# Patient Record
Sex: Female | Born: 1965 | Race: Black or African American | Marital: Married | Smoking: Never smoker
Health system: Southern US, Community
[De-identification: ages and names within clinical notes are randomized; demographics above are authoritative.]

## PROBLEM LIST (undated history)

## (undated) DIAGNOSIS — I10 Essential (primary) hypertension: Secondary | ICD-10-CM

## (undated) DIAGNOSIS — E119 Type 2 diabetes mellitus without complications: Secondary | ICD-10-CM

---

## 2015-04-17 ENCOUNTER — Emergency Department
Admission: EM | Admit: 2015-04-17 | Discharge: 2015-04-17 | Disposition: A | Discharge status: Home / Self Care | Payer: Self-pay | Attending: Emergency Medicine | Admitting: Emergency Medicine

## 2015-04-17 ENCOUNTER — Encounter: Payer: Self-pay | Admitting: Emergency Medicine

## 2015-04-17 ENCOUNTER — Emergency Department: Payer: Self-pay

## 2015-04-17 DIAGNOSIS — Y998 Other external cause status: Secondary | ICD-10-CM | POA: Insufficient documentation

## 2015-04-17 DIAGNOSIS — Y9289 Other specified places as the place of occurrence of the external cause: Secondary | ICD-10-CM | POA: Insufficient documentation

## 2015-04-17 DIAGNOSIS — I10 Essential (primary) hypertension: Secondary | ICD-10-CM | POA: Insufficient documentation

## 2015-04-17 DIAGNOSIS — Y9389 Activity, other specified: Secondary | ICD-10-CM | POA: Insufficient documentation

## 2015-04-17 DIAGNOSIS — E119 Type 2 diabetes mellitus without complications: Secondary | ICD-10-CM | POA: Insufficient documentation

## 2015-04-17 DIAGNOSIS — W01198A Fall on same level from slipping, tripping and stumbling with subsequent striking against other object, initial encounter: Secondary | ICD-10-CM | POA: Insufficient documentation

## 2015-04-17 DIAGNOSIS — S8252XA Displaced fracture of medial malleolus of left tibia, initial encounter for closed fracture: Secondary | ICD-10-CM | POA: Insufficient documentation

## 2015-04-17 HISTORY — DX: Type 2 diabetes mellitus without complications: E11.9

## 2015-04-17 HISTORY — DX: Essential (primary) hypertension: I10

## 2015-04-17 LAB — GLUCOSE, CAPILLARY: Glucose-Capillary: 165 mg/dL — ABNORMAL HIGH (ref 65–99)

## 2015-04-17 MED ORDER — HYDROCODONE-ACETAMINOPHEN 5-325 MG PO TABS: 1.0000 | ORAL_TABLET | ORAL | Status: AC | PRN

## 2015-04-17 MED ORDER — HYDROCODONE-ACETAMINOPHEN 5-325 MG PO TABS
1.0000 | ORAL_TABLET | Freq: Once | ORAL | Status: AC
  Administered 2015-04-17: 1 via ORAL
  Filled 2015-04-17: qty 1

## 2015-04-17 NOTE — ED Provider Notes (Signed)
[facility] Emergency Department Provider Note  ____________________________________________  Time seen: Approximately 12:45 PM  I have reviewed the triage vital signs and the nursing notes.   HISTORY  Chief Complaint Fall   HPI Cynthia Carter is a 49 y.o. female states she fell 2 days ago twisting her left ankle. She continues to have ankle pain and states that pain is increased with trying to bear full weight.Patient states that she lost her balance causing her to fall. It is remains swollen since that time and has increasing pain. She denies any head injury or loss of consciousness during this event. Patient states that currently she is living in this area and that she does not have any money or transportation. Patient currently is not taking any of her prescribed medication as she does not have a doctor in this area. Patient states she was recently came to [facility].   Past Medical History  Diagnosis Date  . Diabetes mellitus without complication (HCC)   . Hypertension     There are no active problems to display for this patient.   History reviewed. No pertinent past surgical history.  Current Outpatient Rx  Name  Route  Sig  Dispense  Refill  . HYDROcodone-acetaminophen (NORCO/VICODIN) 5-325 MG tablet   Oral   Take 1 tablet by mouth every 4 (four) hours as needed for moderate pain.   20 tablet   0     Allergies Excedrin extra strength  History reviewed. No pertinent family history.  Social History Social History  Substance Use Topics  . Smoking status: Never Smoker   . Smokeless tobacco: None  . Alcohol Use: Yes    Review of Systems Constitutional: No fever/chills Eyes: No visual changes. ENT: No trauma Cardiovascular: Denies chest pain. Respiratory: Denies shortness of breath. Gastrointestinal:  No nausea, no vomiting.   Musculoskeletal: Negative for back pain. Positive left ankle pain Skin: Negative for  rash. Neurological: Negative for headaches, focal weakness or numbness.  10-point ROS otherwise negative.  ____________________________________________   PHYSICAL EXAM:  VITAL SIGNS: ED Triage Vitals  Enc Vitals Group     BP 04/17/15 1202 127/86 mmHg     Pulse Rate 04/17/15 1202 88     Resp 04/17/15 1202 18     Temp 04/17/15 1202 97.7 F (36.5 C)     Temp Source 04/17/15 1202 Oral     SpO2 04/17/15 1202 100 %     Weight 04/17/15 1202 190 lb (86.183 kg)     Height 04/17/15 1202  (1.676 m)     Head Cir --      Peak Flow --      Pain Score 04/17/15 1205 10     Pain Loc --      Pain Edu? --      Excl. in GC? --     Constitutional: Alert and oriented. Well appearing and in no acute distress. Eyes: Conjunctivae are normal. PERRL. EOMI. Head: Atraumatic. Nose: No congestion/rhinnorhea. Neck: No stridor.  No cervical tenderness on palpation. Range of motion is without restriction Cardiovascular: Normal rate, regular rhythm. Grossly normal heart sounds.  Good peripheral circulation. Respiratory: Normal respiratory effort.  No retractions. Lungs CTAB. Gastrointestinal: Soft and nontender. No distention.  Musculoskeletal: Moderate tenderness and edema noted left medial and lateral ankle. Range of motion is restricted secondary to patient's pain Neurologic:  Normal speech and language. No gross focal neurologic deficits are appreciated. No gait instability. Skin:  Skin is warm, dry and  intact. No rash noted. Psychiatric: Mood and affect are normal. Speech and behavior are normal.  ____________________________________________   LABS (all labs ordered are listed, but only abnormal results are displayed)  Labs Reviewed  GLUCOSE, CAPILLARY - Abnormal; Notable for the following:    Glucose-Capillary 165 (*)    All other components within normal limits     RADIOLOGY  X-ray left ankle per radiologist reviewed by me shows acute displaced lateral malleolus avulsion fracture.  Also suspicious for a nondisplaced distal tibia medial malleolus fracture per radiologist. Beaulah CorinI, Annastyn Silvey L Shawn Dannenberg, personally viewed and evaluated these images (plain radiographs) as part of my medical decision making.  ____________________________________________   PROCEDURES  Procedure(s) performed: None  Critical Care performed: No  ____________________________________________   INITIAL IMPRESSION / ASSESSMENT AND PLAN / ED COURSE  Pertinent labs & imaging results that were available during my care of the patient were reviewed by me and considered in my medical decision making (see chart for details).  ----------------------------------------- 2:09 PM on 04/17/2015          patient refused to allow ED tech to apply the splint on properly. Patient became somewhat agitated and reportedly almost kicked the ED tech in the head. I explained to her that there was one placement to prevent dropfoot and immobilize her ankle until she can be seen. It is also important to immobilize this to prevent displacement and at this point her fracture is nondisplaced. Patient refuses to allow proper splint placement. [employer] was called while patient was being discharged. Patient was given a prescription for Norco as needed for severe pain and discharged with a walker. Patient is aware that she cannot weight-bear on this foot. She is to call Dr. Rondel BatonMiller's office on Monday for an appointment -----------------------------------------   ____________________________________________   FINAL CLINICAL IMPRESSION(S) / ED DIAGNOSES  Final diagnoses:  Fractured medial malleolus, left, closed, initial encounter      Tommi RumpsRhonda L Leyan Branden, PA-C 04/17/15 1446  Sharyn CreamerMark Quale, MD 04/17/15 [phone]

## 2015-04-17 NOTE — ED Notes (Signed)
Pt is also suppose to take  lisinopril and metformin but has been out of meds d/t no insurance

## 2015-04-17 NOTE — ED Notes (Signed)
Pt fell 2 days ago.  She lots balance and twisted left ankle when falling.  Swelling present. Unable to bear weight per pt.

## 2015-04-17 NOTE — ED Notes (Addendum)
Tried to place splint with [facility] CNA2 after wrapping splint and trying to place foot in correct position for proper healing pt almost kicked the tech in the face and refused to allow staff to complete the splint placement correctly PA Bjorn LoserRhonda is aware of this.

## 2015-04-17 NOTE — ED Notes (Signed)
States she twisted left ankle 2 days ago ankle swollen and tender to touch  Unable to bear full wt.

## 2015-04-17 NOTE — Discharge Instructions (Signed)
Ankle Fracture A fracture is a break in a bone. A cast or splint may be used to protect the ankle and heal the break. Sometimes, surgery is needed. HOME CARE  Use crutches as told by your doctor. It is very important that you use your crutches correctly.  Do not put weight or pressure on the injured ankle until told by your doctor.  Keep your ankle raised (elevated) when sitting or lying down.  Apply ice to the ankle:  Put ice in a plastic bag.  Place a towel between your cast and the bag.  Leave the ice on for 20 minutes, 2-3 times a day.  If you have a plaster or fiberglass cast:  Do not try to scratch under the cast with any objects.  Check the skin around the cast every day. You may put lotion on red or sore areas.  Keep your cast dry and clean.  If you have a plaster splint:  Wear the splint as told by your doctor.  You can loosen the elastic around the splint if your toes get numb, tingle, or turn cold or blue.  Do not put pressure on any part of your cast or splint. It may break. Rest your plaster splint or cast only on a pillow the first 24 hours until it is fully hardened.  Cover your cast or splint with a plastic bag during showers.  Do not lower your cast or splint into water.  Take medicine as told by your doctor.  Do not drive until your doctor says it is safe.  Follow-up with your doctor as told. It is very important that you go to your follow-up visits. GET HELP IF: The swelling and discomfort gets worse.  GET HELP RIGHT AWAY IF:   Your splint or cast breaks.  You continue to have very bad pain.  You have new pain or swelling after your splint or cast was put on.  Your skin or toes below the injured ankle:  Turn blue or gray.  Feel cold, numb, or you cannot feel them.  There is a bad smell or yellowish white fluid (pus) coming from under the splint or cast. MAKE SURE YOU:   Understand these instructions.  Will watch your  condition.  Will get help right away if you are not doing well or get worse.   This information is not intended to replace advice given to you by your health care provider. Make sure you discuss any questions you have with your health care provider.   Document Released: 02/19/2009 Document Revised: 02/12/2013 Document Reviewed: 11/21/2012 Elsevier Interactive Patient Education 2016 [employer]   Ice and elevate your ankle to reduce swelling. Leave splint on until seen by the orthopedist. Use walker for walking do not put any weight on your foot. Norco as needed for pain. You will need to call and make an appointment with Dr. Hyacinth MeekerMiller on Monday.

## 2016-05-08 IMAGING — CR DG ANKLE COMPLETE 3+V*L*
1 series · 3 of 3 positions shown · non-contrast
Comparison: None.

CLINICAL DATA: Twisting left ankle injury, persistent pain and
swelling

EXAM:
LEFT ANKLE COMPLETE - 3+ VIEW

[Series 1: ap · 0.17mm/px · 3 of 3 slices shown]
[im 1/3]
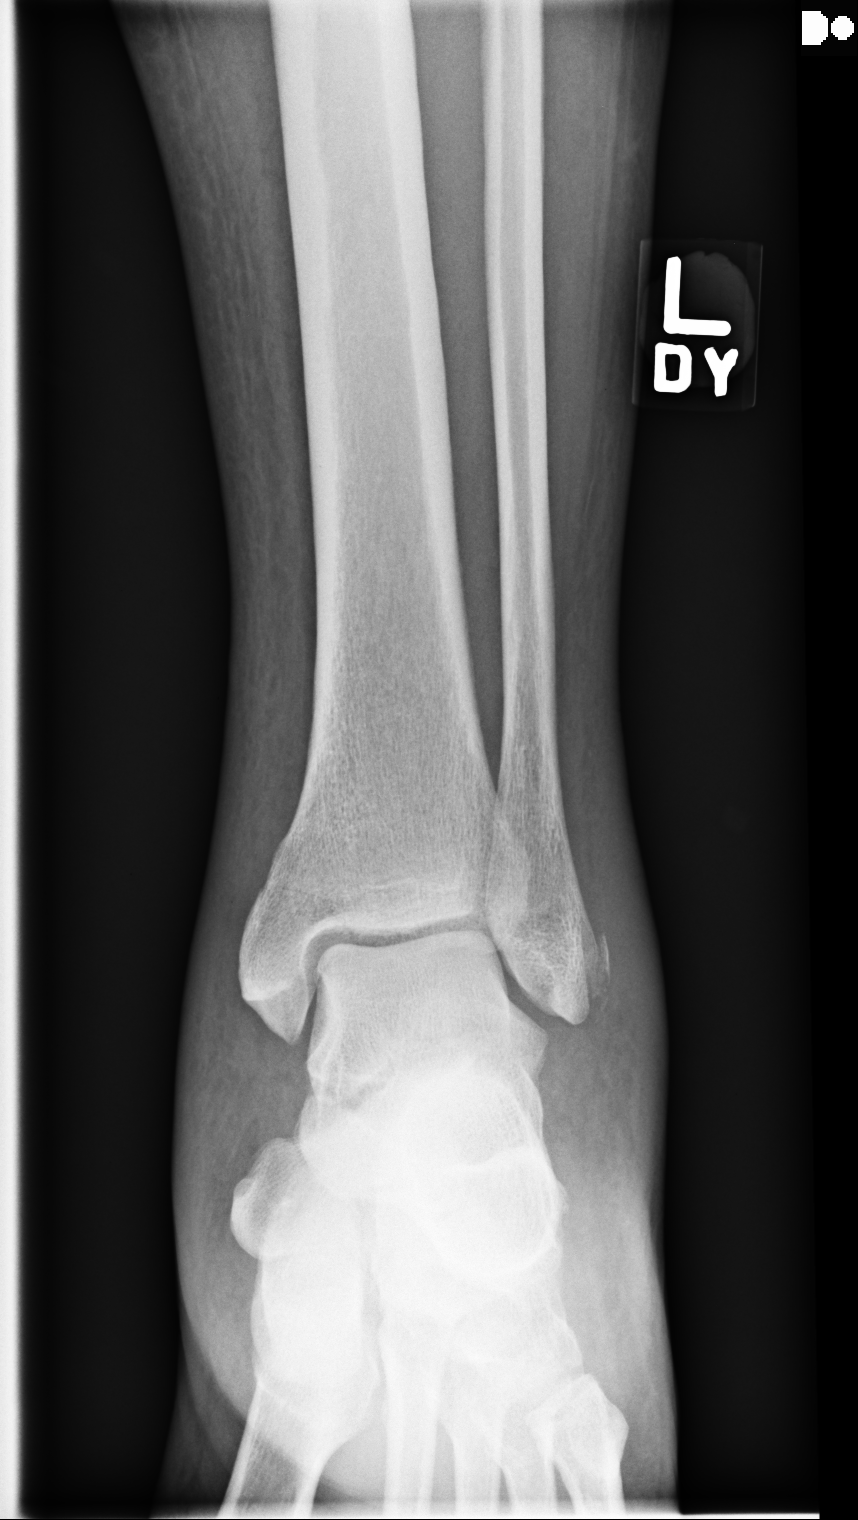
[im 2/3]
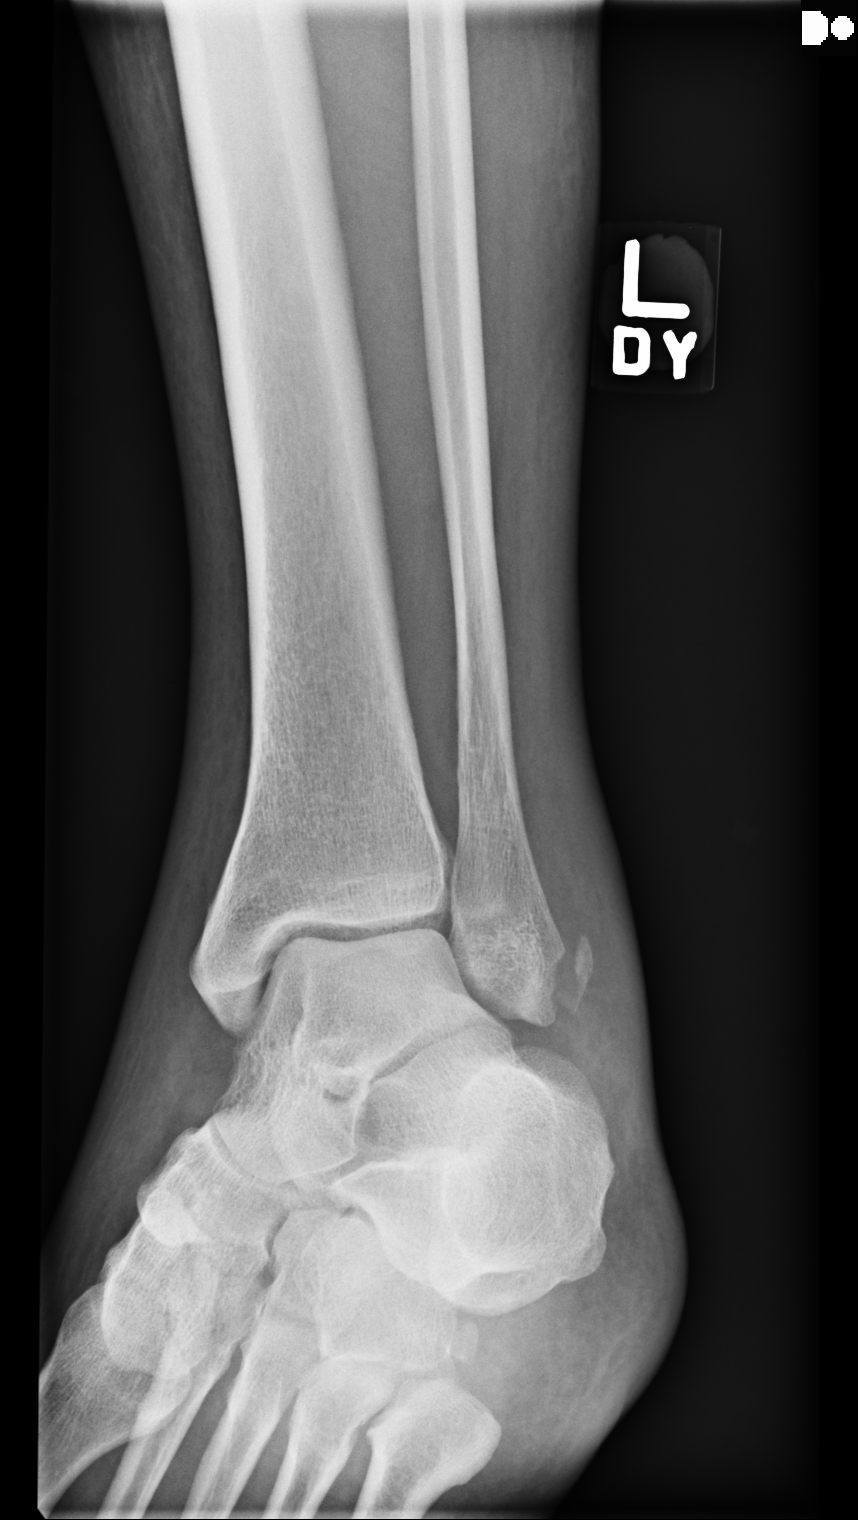
[im 3/3]
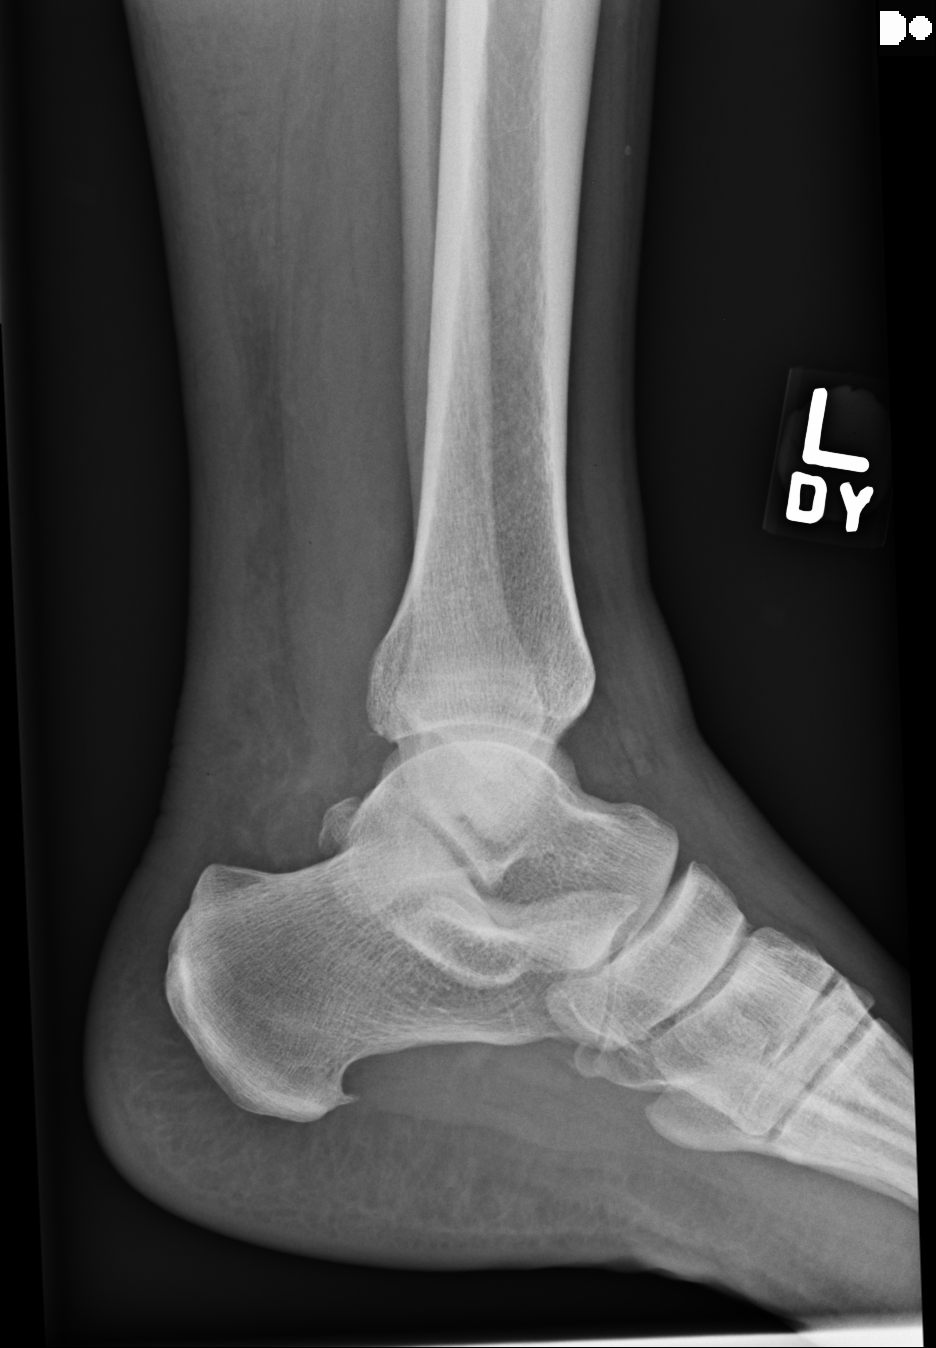

[3 of 3 positions shown; findings below may reference images not displayed]

FINDINGS: Diffuse ankle soft tissue swelling. Small acute displaced avulsion
fracture of the lateral malleolus. Subtle curvilinear lucency
through the left distal tibia medial malleolus appears to involve
the articular surface concerning for a nondisplaced fracture. This
is best appreciated on the AP view. Talus and calcaneus appear
intact. Small plantar calcaneal spur. Normal alignment.
IMPRESSION: Acute displaced lateral malleolar avulsion fracture

Findings suspicious for a nondisplaced distal tibia medial malleolus
fracture.

Diffuse ankle soft tissue swelling

No subluxation or dislocation
